# Patient Record
Sex: Female | Born: 1950 | Race: White | Hispanic: No | Marital: Single | Smoking: Current every day smoker
Health system: Southern US, Community
[De-identification: ages and names within clinical notes are randomized; demographics above are authoritative.]

## PROBLEM LIST (undated history)

## (undated) DIAGNOSIS — F329 Major depressive disorder, single episode, unspecified: Secondary | ICD-10-CM

## (undated) DIAGNOSIS — Z780 Asymptomatic menopausal state: Secondary | ICD-10-CM

## (undated) DIAGNOSIS — J310 Chronic rhinitis: Secondary | ICD-10-CM

## (undated) DIAGNOSIS — F32A Depression, unspecified: Secondary | ICD-10-CM

## (undated) DIAGNOSIS — M8580 Other specified disorders of bone density and structure, unspecified site: Secondary | ICD-10-CM

## (undated) HISTORY — DX: Chronic rhinitis: J31.0

## (undated) HISTORY — DX: Other specified disorders of bone density and structure, unspecified site: M85.80

## (undated) HISTORY — PX: ELBOW SURGERY: SHX618

## (undated) HISTORY — PX: FOOT SURGERY: SHX648

## (undated) HISTORY — DX: Asymptomatic menopausal state: Z78.0

## (undated) HISTORY — PX: NASAL POLYP SURGERY: SHX186

## (undated) HISTORY — PX: OTHER SURGICAL HISTORY: SHX169

## (undated) HISTORY — DX: Depression, unspecified: F32.A

## (undated) HISTORY — DX: Major depressive disorder, single episode, unspecified: F32.9

---

## 1997-09-30 ENCOUNTER — Other Ambulatory Visit: Admission: RE | Admit: 1997-09-30 | Discharge: 1997-09-30 | Payer: Self-pay | Admitting: Obstetrics and Gynecology

## 1997-10-15 ENCOUNTER — Other Ambulatory Visit: Admission: RE | Admit: 1997-10-15 | Discharge: 1997-10-15 | Payer: Self-pay | Admitting: Obstetrics & Gynecology

## 1997-10-24 ENCOUNTER — Other Ambulatory Visit: Admission: RE | Admit: 1997-10-24 | Discharge: 1997-10-24 | Payer: Self-pay | Admitting: Obstetrics and Gynecology

## 1997-11-13 ENCOUNTER — Other Ambulatory Visit: Admission: RE | Admit: 1997-11-13 | Discharge: 1997-11-13 | Payer: Self-pay | Admitting: Obstetrics and Gynecology

## 1999-07-29 ENCOUNTER — Other Ambulatory Visit: Admission: RE | Admit: 1999-07-29 | Discharge: 1999-07-29 | Payer: Self-pay | Admitting: Obstetrics and Gynecology

## 1999-12-29 ENCOUNTER — Encounter: Admission: RE | Admit: 1999-12-29 | Discharge: 1999-12-29 | Payer: Self-pay | Admitting: Obstetrics and Gynecology

## 1999-12-29 ENCOUNTER — Encounter: Payer: Self-pay | Admitting: Obstetrics and Gynecology

## 2001-03-14 ENCOUNTER — Encounter: Admission: RE | Admit: 2001-03-14 | Discharge: 2001-03-14 | Payer: Self-pay | Admitting: *Deleted

## 2001-07-09 ENCOUNTER — Other Ambulatory Visit: Admission: RE | Admit: 2001-07-09 | Discharge: 2001-07-09 | Payer: Self-pay | Admitting: Obstetrics and Gynecology

## 2002-03-20 ENCOUNTER — Encounter: Payer: Self-pay | Admitting: Internal Medicine

## 2002-03-20 ENCOUNTER — Encounter: Admission: RE | Admit: 2002-03-20 | Discharge: 2002-03-20 | Payer: Self-pay | Admitting: Internal Medicine

## 2002-07-29 ENCOUNTER — Ambulatory Visit (HOSPITAL_BASED_OUTPATIENT_CLINIC_OR_DEPARTMENT_OTHER): Admission: RE | Admit: 2002-07-29 | Discharge: 2002-07-29 | Payer: Self-pay | Admitting: *Deleted

## 2002-07-29 ENCOUNTER — Encounter (INDEPENDENT_AMBULATORY_CARE_PROVIDER_SITE_OTHER): Payer: Self-pay | Admitting: *Deleted

## 2002-09-24 ENCOUNTER — Other Ambulatory Visit: Admission: RE | Admit: 2002-09-24 | Discharge: 2002-09-24 | Payer: Self-pay | Admitting: *Deleted

## 2002-11-06 ENCOUNTER — Encounter: Admission: RE | Admit: 2002-11-06 | Discharge: 2002-11-06 | Payer: Self-pay | Admitting: Family Medicine

## 2002-11-06 ENCOUNTER — Encounter: Payer: Self-pay | Admitting: Family Medicine

## 2003-04-24 ENCOUNTER — Encounter: Admission: RE | Admit: 2003-04-24 | Discharge: 2003-04-24 | Payer: Self-pay | Admitting: Obstetrics and Gynecology

## 2003-10-02 ENCOUNTER — Other Ambulatory Visit: Admission: RE | Admit: 2003-10-02 | Discharge: 2003-10-02 | Payer: Self-pay | Admitting: Obstetrics and Gynecology

## 2003-11-20 ENCOUNTER — Encounter: Admission: RE | Admit: 2003-11-20 | Discharge: 2003-11-20 | Payer: Self-pay | Admitting: Internal Medicine

## 2003-11-27 ENCOUNTER — Encounter: Admission: RE | Admit: 2003-11-27 | Discharge: 2003-11-27 | Payer: Self-pay | Admitting: Internal Medicine

## 2004-06-03 ENCOUNTER — Encounter: Admission: RE | Admit: 2004-06-03 | Discharge: 2004-06-03 | Payer: Self-pay | Admitting: Obstetrics and Gynecology

## 2004-06-10 ENCOUNTER — Ambulatory Visit: Payer: Self-pay | Admitting: Internal Medicine

## 2005-03-31 ENCOUNTER — Other Ambulatory Visit: Admission: RE | Admit: 2005-03-31 | Discharge: 2005-03-31 | Payer: Self-pay | Admitting: Obstetrics and Gynecology

## 2005-07-07 ENCOUNTER — Encounter: Admission: RE | Admit: 2005-07-07 | Discharge: 2005-07-07 | Payer: Self-pay | Admitting: Obstetrics and Gynecology

## 2005-08-17 ENCOUNTER — Ambulatory Visit: Payer: Self-pay | Admitting: Internal Medicine

## 2006-05-26 ENCOUNTER — Ambulatory Visit: Payer: Self-pay | Admitting: Internal Medicine

## 2006-05-26 LAB — CONVERTED CEMR LAB
ALT: 19 units/L (ref 0–40)
Albumin: 3.9 g/dL (ref 3.5–5.2)
Alkaline Phosphatase: 63 units/L (ref 39–117)
Calcium: 9.5 mg/dL (ref 8.4–10.5)
Chloride: 106 meq/L (ref 96–112)
GFR calc non Af Amer: 69 mL/min
HCT: 42 % (ref 36.0–46.0)
HDL: 68.3 mg/dL (ref >39.0)
Hemoglobin: 14.5 g/dL (ref 12.0–15.0)
Lymphocytes Relative: 30.1 % (ref 12.0–46.0)
MCHC: 34.5 g/dL (ref 30.0–36.0)
MCV: 92.6 fL (ref 78.0–100.0)
Monocytes Absolute: 0.4 10*3/uL (ref 0.2–0.7)
Neutrophils Relative %: 60.4 % (ref 43.0–77.0)
Potassium: 4.5 meq/L (ref 3.5–5.1)
RBC: 4.54 M/uL (ref 3.87–5.11)
RDW: 12.8 % (ref 11.5–14.6)
Sodium: 143 meq/L (ref 135–145)
TSH: 1.84 microintl units/mL (ref 0.35–5.50)
Total Bilirubin: 0.9 mg/dL (ref 0.3–1.2)

## 2006-07-13 ENCOUNTER — Encounter: Admission: RE | Admit: 2006-07-13 | Discharge: 2006-07-13 | Payer: Self-pay | Admitting: Obstetrics and Gynecology

## 2006-11-15 ENCOUNTER — Encounter: Payer: Self-pay | Admitting: Family Medicine

## 2006-11-16 ENCOUNTER — Ambulatory Visit: Payer: Self-pay | Admitting: Family Medicine

## 2006-11-16 DIAGNOSIS — F329 Major depressive disorder, single episode, unspecified: Secondary | ICD-10-CM | POA: Insufficient documentation

## 2007-07-19 ENCOUNTER — Encounter: Admission: RE | Admit: 2007-07-19 | Discharge: 2007-07-19 | Payer: Self-pay | Admitting: Obstetrics and Gynecology

## 2007-08-02 ENCOUNTER — Telehealth (INDEPENDENT_AMBULATORY_CARE_PROVIDER_SITE_OTHER): Payer: Self-pay | Admitting: *Deleted

## 2007-09-13 ENCOUNTER — Telehealth (INDEPENDENT_AMBULATORY_CARE_PROVIDER_SITE_OTHER): Payer: Self-pay | Admitting: *Deleted

## 2007-09-20 ENCOUNTER — Ambulatory Visit: Payer: Self-pay | Admitting: Internal Medicine

## 2007-09-20 DIAGNOSIS — M899 Disorder of bone, unspecified: Secondary | ICD-10-CM | POA: Insufficient documentation

## 2007-09-20 DIAGNOSIS — J309 Allergic rhinitis, unspecified: Secondary | ICD-10-CM | POA: Insufficient documentation

## 2007-09-20 DIAGNOSIS — M949 Disorder of cartilage, unspecified: Secondary | ICD-10-CM

## 2008-02-21 ENCOUNTER — Ambulatory Visit: Payer: Self-pay | Admitting: Internal Medicine

## 2008-02-21 DIAGNOSIS — F17200 Nicotine dependence, unspecified, uncomplicated: Secondary | ICD-10-CM | POA: Insufficient documentation

## 2008-02-21 DIAGNOSIS — R1033 Periumbilical pain: Secondary | ICD-10-CM | POA: Insufficient documentation

## 2008-02-21 LAB — CONVERTED CEMR LAB
Bilirubin Urine: NEGATIVE
Blood in Urine, dipstick: NEGATIVE
Ketones, urine, test strip: NEGATIVE
Nitrite: NEGATIVE
Specific Gravity, Urine: <1.005

## 2008-03-03 ENCOUNTER — Encounter (INDEPENDENT_AMBULATORY_CARE_PROVIDER_SITE_OTHER): Payer: Self-pay | Admitting: *Deleted

## 2008-03-03 LAB — CONVERTED CEMR LAB
ALT: 20 units/L (ref 0–35)
AST: 24 units/L (ref 0–37)
Albumin: 4.2 g/dL (ref 3.5–5.2)
Alkaline Phosphatase: 65 units/L (ref 39–117)
Basophils Absolute: 0 10*3/uL (ref 0.0–0.1)
Basophils Relative: 0.7 % (ref 0.0–3.0)
Bilirubin, Direct: 0.2 mg/dL (ref 0.0–0.3)
HCT: 43.1 % (ref 36.0–46.0)
Hemoglobin: 14.9 g/dL (ref 12.0–15.0)
MCHC: 34.7 g/dL (ref 30.0–36.0)
Neutrophils Relative %: 62.6 % (ref 43.0–77.0)
Platelets: 222 10*3/uL (ref 150–400)
RDW: 12.8 % (ref 11.5–14.6)
Total Protein: 7.4 g/dL (ref 6.0–8.3)
Vit D, 1,25-Dihydroxy: 35 (ref 30–89)
WBC: 5 10*3/uL (ref 4.5–10.5)

## 2008-03-10 ENCOUNTER — Ambulatory Visit: Payer: Self-pay | Admitting: Internal Medicine

## 2008-03-11 ENCOUNTER — Encounter (INDEPENDENT_AMBULATORY_CARE_PROVIDER_SITE_OTHER): Payer: Self-pay | Admitting: *Deleted

## 2008-05-23 HISTORY — PX: UPPER GASTROINTESTINAL ENDOSCOPY: SHX188

## 2008-05-23 HISTORY — PX: COLONOSCOPY W/ POLYPECTOMY: SHX1380

## 2008-05-23 HISTORY — PX: CHOLECYSTECTOMY: SHX55

## 2008-09-04 ENCOUNTER — Encounter: Admission: RE | Admit: 2008-09-04 | Discharge: 2008-09-04 | Payer: Self-pay | Admitting: Gastroenterology

## 2008-09-26 ENCOUNTER — Encounter: Payer: Self-pay | Admitting: Internal Medicine

## 2008-10-06 ENCOUNTER — Encounter: Payer: Self-pay | Admitting: Internal Medicine

## 2008-11-13 ENCOUNTER — Encounter: Payer: Self-pay | Admitting: Internal Medicine

## 2008-12-01 ENCOUNTER — Ambulatory Visit (HOSPITAL_COMMUNITY): Admission: RE | Admit: 2008-12-01 | Discharge: 2008-12-01 | Payer: Self-pay | Admitting: Surgery

## 2008-12-04 ENCOUNTER — Telehealth (INDEPENDENT_AMBULATORY_CARE_PROVIDER_SITE_OTHER): Payer: Self-pay | Admitting: *Deleted

## 2008-12-19 ENCOUNTER — Ambulatory Visit (HOSPITAL_COMMUNITY): Admission: RE | Admit: 2008-12-19 | Discharge: 2008-12-19 | Payer: Self-pay | Admitting: Surgery

## 2008-12-19 ENCOUNTER — Encounter (INDEPENDENT_AMBULATORY_CARE_PROVIDER_SITE_OTHER): Payer: Self-pay | Admitting: Surgery

## 2009-01-08 ENCOUNTER — Encounter: Payer: Self-pay | Admitting: Internal Medicine

## 2009-03-27 ENCOUNTER — Encounter (INDEPENDENT_AMBULATORY_CARE_PROVIDER_SITE_OTHER): Payer: Self-pay | Admitting: *Deleted

## 2009-07-09 ENCOUNTER — Ambulatory Visit: Payer: Self-pay | Admitting: Internal Medicine

## 2009-07-09 DIAGNOSIS — F411 Generalized anxiety disorder: Secondary | ICD-10-CM | POA: Insufficient documentation

## 2009-07-30 ENCOUNTER — Encounter: Admission: RE | Admit: 2009-07-30 | Discharge: 2009-07-30 | Payer: Self-pay | Admitting: Obstetrics and Gynecology

## 2009-10-07 ENCOUNTER — Telehealth (INDEPENDENT_AMBULATORY_CARE_PROVIDER_SITE_OTHER): Payer: Self-pay | Admitting: *Deleted

## 2010-04-08 ENCOUNTER — Telehealth (INDEPENDENT_AMBULATORY_CARE_PROVIDER_SITE_OTHER): Payer: Self-pay | Admitting: *Deleted

## 2010-06-22 NOTE — Progress Notes (Signed)
Summary: Sertraline refill  Phone Note Refill Request Message from:  Fax from Pharmacy on April 08, 2010 5:00 PM  Refills Requested: Medication #1:  SERTRALINE HCL 50 MG daily as directed   Last Refilled: 01/04/2010   Notes: [facility] [address], [place], [place]   ph=[phone]   fax [phone]   qty =90  Initial call taken by: Jerolyn Shin,  April 08, 2010 5:04 PM  Follow-up for Phone Call        Left message on machine for patient to return call when avaliable, Reason for call:   ? how patient is taking medication, instructions say as directed and the pharmacy will send back and ask for specific instruction Follow-up by: Shonna Chock CMA,  April 09, 2010 8:24 AM  Additional Follow-up for Phone Call Additional follow up Details #1::        Called patient on work number-disconnected  Tried to reach patient again at home number:Spoke with patient and she indicated that she is taking 1 by mouth once daily of 50mg  tab, noted on med list and sent back to pharmacy Additional Follow-up by: Shonna Chock CMA,  April 09, 2010 11:38 AM    New/Updated Medications: * SERTRALINE HCL 50 MG 1 by mouth once daily Prescriptions: SERTRALINE HCL 50 MG 1 by mouth once daily  #90 x 2   Entered by:   Shonna Chock CMA   Authorized by:   Marga Melnick MD   Signed by:   Shonna Chock CMA on 04/09/2010   Method used:   Faxed to ...       [facility] [address] [facility] [address] (retail)       [address] [place], [place]  [address]       Ph: [phone]       Fax: [phone]   RxID:   [id number]

## 2010-06-22 NOTE — Progress Notes (Signed)
Summary: refill   Phone Note Refill Request Message from:  Fax from Pharmacy on Oct 07, 2009 4:05 PM  Refills Requested: Medication #1:  SERTRALINE HCL 50 MG daily as directed fax from [place] - fax [phone]  - ph [phone]   Method Requested: Fax to Local Pharmacy Initial call taken by: Okey Regal Spring,  Oct 07, 2009 4:06 PM    Prescriptions: SERTRALINE HCL 50 MG daily as directed  #90 x 1   Entered by:   Shonna Chock   Authorized by:   Marga Melnick MD   Signed by:   Shonna Chock on 10/07/2009   Method used:   Faxed to ...       [facility] [address] [facility] [address] (retail)       [address] [place], [place]  [address]       Ph: [phone]       Fax: [phone]   RxID:   [id number]

## 2010-06-22 NOTE — Assessment & Plan Note (Signed)
Summary: refill antidepressant//lch   Vital Signs:  Patient profile:   60 year old female Weight:      175 pounds Pulse rate:   72 / minute Resp:     15 per minute BP sitting:   130 / 60  Vitals Entered By: rachel peeler CC: discuss meds Comments pt. wants to discuss zoloft, and needs omnaris refilled pt. wants to schedule a cpx   Primary Care Provider:  Laury Axon  CC:  discuss meds.  History of Present Illness: Her best friend has lung cancer; this has been major stress. Sertraline 50 mg  was weaned several months ago. With dose every 2 days her menopausal symptoms worsened.  Allergies: 1)  ! Pcn 2)  ! Claritin 3)  ! * Astelin 4)  ! Avelox 5)  ! Fosamax 6)  ! Lexapro 7)  ! * Thimoserol  Past History:  Past Surgical History: ELBOW SURGERY SHATTERED 2 to fall gravid 0 para 0 sinus surgery 07/29/2002 foot surgery pinned hammer toe colonoscopy negative 2001, done for gas Cholecystectomy 2010, Dr Michaell Cowing  Review of Systems General:  Denies sleep disorder. CV:  Denies palpitations. GI:  Denies diarrhea. Neuro:  Denies numbness, tingling, and tremors. Psych:  Complains of anxiety; denies depression, easily angered, easily tearful, irritability, and panic attacks.  Physical Exam  General:  well-nourished; alert,appropriate and cooperative throughout examination Eyes:  No corneal or conjunctival inflammation noted.  Perrla. No lid lag Neck:  No deformities, masses, or tenderness noted. Heart:  Normal rate and regular rhythm. S1 and S2 normal without gallop, murmur, click, rub. Neurologic:  alert & oriented X3 and DTRs symmetrical and normal.  No tremor Skin:  Intact without suspicious lesions or rashes Psych:  memory intact for recent and remote, normally interactive, good eye contact, not anxious appearing, and not depressed appearing.     Impression & Recommendations:  Problem # 1:  ANXIETY DISORDER (ICD-300.00) Sertraline 50 mg 1/2 to 1 once daily as needed to  control anxiety  Problem # 2:  SMOKER (ICD-305.1) risks discussed  Complete Medication List: 1)  Sertraline Hcl 50 Mg  .... Daily as directed 2)  Veramyst 27.5 Mcg/spray Susp (Fluticasone furoate) .Marland Kitchen.. 1 spray two times a day as needed 3)  Ranitidine Hcl 150 Mg Tabs (Ranitidine hcl) .Marland Kitchen.. 1 q 12 ac meals as needed 4)  Mvi (2000iu Vit D)  .Marland Kitchen.. 1t daily 5)  Calcium 600mg   .... 1t daily 6)  Progesteron Cream (20ml)  .... Every day  Patient Instructions: 1)  Zoloft 50 mg 1/2 once daily  Prescriptions: SERTRALINE HCL 50 MG daily as directed  #90 x 0   Entered and Authorized by:   Marga Melnick MD   Signed by:   Marga Melnick MD on 07/09/2009   Method used:   Print then Give to Patient   RxID:   [id number]

## 2010-08-03 ENCOUNTER — Telehealth (INDEPENDENT_AMBULATORY_CARE_PROVIDER_SITE_OTHER): Payer: Self-pay | Admitting: *Deleted

## 2010-08-10 NOTE — Progress Notes (Signed)
Summary: pt friend dying needs rx   Phone Note Call from Patient Call back at [phone]   Caller: Patient Call For: Marga Melnick MD Summary of Call: patients friend of 30 years is dying & patient wantsrx called in to help her  get [facility] thru [facility] [address] Initial call taken by: Okey Regal Spring,  August 03, 2010 1:35 PM  Follow-up for Phone Call        pls advise.Marland KitchenDoristine Devoid CMA  August 03, 2010 2:11 PM   Additional Follow-up for Phone Call Additional follow up Details #1::        see Rx Additional Follow-up by: Marga Melnick MD,  August 03, 2010 4:51 PM    Additional Follow-up for Phone Call Additional follow up Details #2::    Left message on voicemail informing patient rx sent to pharmacy  Follow-up by: Shonna Chock CMA,  August 03, 2010 4:57 PM  New/Updated Medications: LORAZEPAM 0.5 MG TABS (LORAZEPAM) 1 every 8 hrs as needed stress Prescriptions: LORAZEPAM 0.5 MG TABS (LORAZEPAM) 1 every 8 hrs as needed stress  #30 x 0   Entered and Authorized by:   Marga Melnick MD   Signed by:   Marga Melnick MD on 08/03/2010   Method used:   Printed then faxed to ...       [facility] [address] [phone]* (retail)       [address]       [place], [place]  [address]       Ph: [phone]       Fax: [phone]   RxID:   [id number]   Appended Document: pt friend dying needs rx  Note from pharmacy requested we resend or call in rx, rx called in  Appended Document: Rx called to wrong pharmacy     Phone Note Call from Patient Call back at [phone]   Caller: Patient Call For: Marga Melnick MD Summary of Call: Patient states the medication was called into the wrong pharmacy. [facility] Initial call taken by: Barnie Mort,  August 05, 2010 11:02 AM  Follow-up for Phone Call        [facility] [address]. Called [facility] and cancelled rx  Follow-up by: Shonna Chock CMA,  August 05, 2010 11:05 AM

## 2010-08-29 LAB — HEMOGLOBIN AND HEMATOCRIT, BLOOD: HCT: 40.2 % (ref 36.0–46.0)

## 2010-10-05 NOTE — Op Note (Signed)
NAMEBRITTLEY, REGNER                ACCOUNT NO.:  [id number]   MEDICAL RECORD NO.:  [id number]          PATIENT TYPE:  AMB   LOCATION:  DAY                          FACILITY[facility]  [facility]   PHYSICIAN:  Ardeth Sportsman, MD     DATE OF BIRTH:  [birth year]   DATE OF PROCEDURE:  DATE OF DISCHARGE:  12/19/2008                               OPERATIVE REPORT   PRIMARY CARE PHYSICIAN:  Dr. Marga Melnick.   GASTROENTEROLOGIST:  Dr. Dorena Cookey at [facility].   SURGEON:  Dr. Karie Soda.   ASSISTANT:  None.   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   POSTOPERATIVE DIAGNOSES:  Biliary dyskinesia with probable chronic  cholecystitis.   PROCEDURE PERFORMED:  Laparoscopic cholecystectomy, single-site  technique with intraoperative cholangiogram.   ANESTHESIA:  1. General anesthesia.  2. Local anesthetic in a field block around all port sites.   SPECIMENS:  Gallbladder.   DRAINS:  None.   ESTIMATED BLOOD LOSS:  20 mL.   COMPLICATIONS:  None apparent.   INDICATIONS:  Ms. Nakata is a pleasant 60 year old female who has been  struggling with some intermittent abdominal pains in the past and has  had an extensive workup, including upper and lower endoscopy by Dr.  Madilyn Fireman that showed some Helicobacter pylori positive.  She had an  ultrasound that was negative, but a HIDA scan which was suggestive of  biliary dyskinesia given the fact that her gallbladder ejection fraction  was 6% and reproduced symptoms.   The anatomy and physiology of hepatobiliary and pancreatic function was  discussed.  The pathophysiology of biliary dyskinesia and chronic  cholecystitis was discussed.  Options discussed and recommendations made  for laparoscopic cholecystectomy with intraoperative cholangiogram.  The  risks, benefits and alternatives were discussed.  Questions answered.  She agreed to proceed.  The risk that the surgery would not control her  pain and  symptoms would continue, arguing that there was another source  of the pain and symptoms was discussed as well.  The patient agreed to  proceed.   OPERATIVE FINDINGS:  She had moderate gallbladder wall thickening  concerning for chronic cholecystitis.  Her cystic duct did not seem  particularly narrow.  Her biliary system appeared normal.  The rest of  her intra-abdominal organs appeared normal as well.   DESCRIPTION OF PROCEDURE:  Informed consent was confirmed.  The patient  underwent general anesthesia any difficulty.  She had voided just prior  to the operating room.  She had sequential compression devices active  during the entire case.  She was positioned supine with both arms  tucked.  Her abdomen was prepped and draped in a sterile fashion.  Surgical timeout confirmed our plan.   Entry was gained in the abdomen through a supraumbilical 3-cm  curvilinear incision.  I placed a #5 mm port supraumbilically through  the stalk using Hassan technique.  Capnoperitoneum to 15 with 5 mmHg  initially provided preperitoneal insufflation, but with redirection I  was able to put the port back into the peritoneum and capnoperitoneum  was induced.  Camera  inspection revealed no intra-abdominal injury.  Under direct visualization, a 5-mm port was placed a fingerbreadth to  the left paramedian of this.  A #5 mL blunt grasper was placed a  fingerbreadth right in and inferior to the port site directly through  the fascia.  Camera inspection revealed no intra-abdominal injury.   The patient was positioned right side up, head side up.  The gallbladder  fundus was grasped and then I gradually marched up to the middle part of  the gallbladder to elevate it cephalad.  This exposed the infundibulum  of the gallbladder well.  Ultrasonic dissection was used to free the  peritoneal covering between the infundibulum and posterolateral wall of  the gallbladder off the liver.  Dissection was done to help  free the  back wall.   The infundibulum was pulled more laterally and I was able to use the  ultrasonic dissection to free the peritoneal coverings around the  infundibulum up the anteromedial wall of the gallbladder.  Meticulous  dissection was done between ultrasonic dissection and blunt Maryland  dissector dissection to get a good critical view by freeing the proximal  half of the gallbladder off the liver bed.  I did a little extra  dissection so I could pull the gallbladder out laterally and get a good  critical view using an angled scope.  Once I confirmed that only the  cystic artery and cystic duct were going down the porta hepatis, I went  ahead and ligated the cystic artery using ultrasonic dissection to good  result.   This left just the cystic duct leaving the infundibulum.  I skeletonized  it carefully further.  Two clips were placed in the infundibulum and a  partial cystic ductotomy was performed.  I got clear bile back.  There  were no stones to be milked back.  A #5-French cholangiocatheter was  placed through a right subcostal puncture site and into the cystic duct.   I ran a cholangiogram using dilute radiopaque contrast and continuous  fluoroscopy.  Contrast flowed from a helical side branch, consistent  with cystic duct cannulization.  Contrast easily flowed into the common,  hepatic and bile ducts down across a normal ampulla to the duodenum  easily.  It gradually refluxed up into the right and left intrahepatic  chains with classic anatomy and no evidence of any injury, stones or  other abnormalities.  The cholangiocatheter was removed.  I placed four  clips on the cystic duct just proximal to the cystic ductotomy since  there was a nice long cystic duct.  With that, I completed transection  of the cystic duct.  The gallbladder was freed from its remaining  attachments on the liver bed and pulled out the supraumbilical fascial  defect by opening it up carefully  bluntly.  The fascial defect was  reapproximated using 0 Vicryl interrupted stitches x3.   Camera inspection revealed no intra-abdominal injury.  Hemostasis was  excellent on the liver bed and the clips were intact on the cystic duct  stump.  Copious irrigation was done with clear return at the end and  there was no more bleeding.  Again, I made reinspection of the entire  abdomen and there was no evidence of any bowel injury or other  abnormalities.  The patient was extubated and sent to the recovery room  in stable condition.   I discussed postoperative care with the patient in detail in my office  and just prior to surgery and  discussed it with her family right after  surgery as well.  They expressed understanding and appreciation.      Ardeth Sportsman, MD  Electronically Signed     SCG/MEDQ  D:  12/19/2008  T:  12/20/2008  Job:  [id number]   cc:   Everardo All. Madilyn Fireman, M.D.  Fax: [phone]   Titus Dubin. Alwyn Ren, MD,FACP,FCCP  [phone]  [place] [address]

## 2010-10-08 NOTE — Op Note (Signed)
Caroline Graves, Caroline Graves                          ACCOUNT NO.:  [id number]   MEDICAL RECORD NO.:  [id number]                   PATIENT TYPE:  AMB   LOCATION:  DSC                                  FACILITY:  [facility]   PHYSICIAN:  Kathy Breach, M.D.                   DATE OF BIRTH:  04-21-1951   DATE OF PROCEDURE:  07/29/2002  DATE OF DISCHARGE:                                 OPERATIVE REPORT   PREOPERATIVE DIAGNOSES:  1. Deviated nasal septum.  2. Hyperplastic nasal turbinates.  3. Pansinus nasal polyposis.   PROCEDURES:  1. Nasal septoplasty.  2. Bilateral endoscopic complete ethmoidectomy.  3. Bilateral endoscopic ostiomeatal windows.  4. Bilateral submucous resection, inferior turbinates.   POSTOPERATIVE DIAGNOSES:  1. Deviated nasal septum.  2. Hyperplastic nasal turbinates.  3. Pansinus nasal polyposis.   DESCRIPTION OF PROCEDURE:  With the patient under general orotracheal  anesthesia, the nose was prepped and draped in a sterile fashion.  Nasal  constriction by topical 4% Xylocaine/ephedrine solution on olive-tipped  probes applied to the anterior ethmoid and sphenopalatine nerve areas  bilaterally.  Cotton pledgets soaked in a similar solution inserted along  the middle and inferior turbinates.  The columella and nasal septum were  infiltrated with 1% Xylocaine and 1:100,000 epinephrine.  As the case  proceeded, infiltration of the middle meatus and inferior turbinates  likewise.  Examination of the patient's nose immediately revealed large,  hyperplastic turbinates, inferior and middle, nearly totally obstructing the  airways.  With vasoconstriction, a sharp 4+ vomerine spur to the left as  well as a general deviation of the superior septum to the left present.  A  caudal incision was made inside the left nasal vestibule and mucoperiosteal  flaps were elevated off the cartilaginous and bony septum bilaterally.  The  posterior quadrilateral cartilage separated from the  vomer and the vomerine  spur removed.  The quadrilateral cartilage separated posterior superiorly  from the deflected perpendicular ethmoid plate, which was also removed.  The  entire quadrilateral cartilage was kept intact and positioned anteriorly.  This gave a straight near-midline septum.   Under endoscopic visualization beginning on the left side, the middle  turbinate was medially fractured.  The middle meatus filled with polypoid  degeneration of the mucosa and some frank polyps.  Using the suction  debrider, complete ethmoidectomy was done.  On the left side all the ethmoid  cells filled, packed with allergic-like polyps.  There was some air in the  posteriormost ethmoid cell on the left side consistent with findings of the  preoperative CT scans.  As polypoid degeneration of the posterior extent of  the middle turbinate and the sphenoid rostrum area cleared, the ostium of  the left sphenoid sinus penetrated readily with suction and cleared.  Working anterior along the roof of the ethmoid, the frontal recess area was  cleaned and polyps removed  and there was good completion of the anterior  ethmoid superiorly.  The curved suction could be readily passed through the  frontal recess into the frontal sinus area.  The ostiomeatal area was probed  and readily penetrated through the membranous area and a 1 cm window into  the left antrum was created.   Essentially a similar procedure was done on the right side.  The right  middle turbinate, having much more polypoid degeneration and with relatively  minimal bleeding on the left side, fairly constant oozing, bleeding from the  middle turbinate on the right side slowed the procedure and added to the  blood loss, but complete ethmoidectomy was completed as well as enlarging  the ostiomeatal area on the left side as well as cleaning the anterior  ethmoid cells and frontal recess, in completion of which the curved suction  could be readily  passed into the frontal sinus.   Stab incisions were then made over the anterior aspect of the left inferior  turbinate.  The superiorly-based septal surface mucosa elevated off the  large, prominent turbinate bone.  The lower half of the turbinate bone with  attached mucosa was sharply excised with angled scissors.  The posterior  extension was reduced by cauterization and hemostasis along the bony and  mucosal resection line of the turbinate controlled with suction cautery.  The remaining inferior turbinate bone was outfractured.  A similar procedure  was done for the right inferior turbinate.   The septal incision was then closed with interrupted 4-0 chromic catgut  sutures.  The nose was then packed bilaterally with packing, initially the  ethmoidectomy site and then the nasal chamber with strips of Vaseline gauze  impregnated with bacitracin ointment.  At the completion of the case the  oropharynx and nasopharynx suctioned clear and no active bleeding into the  nasopharynx occurring.  Blood loss by suction canister estimated at around  500 mL.  The patient tolerated the procedure well and was taken to the  recovery room in stable general condition.                                               Kathy Breach, M.D.    Caroline Graves  D:  07/29/2002  T:  07/29/2002  Job:  [id number]

## 2010-12-31 ENCOUNTER — Other Ambulatory Visit: Payer: Self-pay | Admitting: Internal Medicine

## 2010-12-31 MED ORDER — SERTRALINE HCL 50 MG PO TABS: 50.0000 mg | ORAL_TABLET | Freq: Every day | ORAL | Status: DC

## 2010-12-31 NOTE — Telephone Encounter (Signed)
RX Sent to the pharmacy, patient needs to schedule a CPX

## 2011-01-18 ENCOUNTER — Other Ambulatory Visit: Payer: Self-pay | Admitting: *Deleted

## 2011-01-18 MED ORDER — CICLESONIDE 50 MCG/ACT NA SUSP: 2.0000 | Freq: Every day | NASAL | Status: DC

## 2011-03-02 ENCOUNTER — Encounter: Payer: Self-pay | Admitting: Internal Medicine

## 2011-03-03 ENCOUNTER — Encounter: Payer: Self-pay | Admitting: Internal Medicine

## 2011-03-03 ENCOUNTER — Ambulatory Visit (INDEPENDENT_AMBULATORY_CARE_PROVIDER_SITE_OTHER): Payer: Federal, State, Local not specified - PPO | Admitting: Internal Medicine

## 2011-03-03 DIAGNOSIS — Z8249 Family history of ischemic heart disease and other diseases of the circulatory system: Secondary | ICD-10-CM

## 2011-03-03 DIAGNOSIS — Z0000 Encounter for general adult medical examination without abnormal findings: Secondary | ICD-10-CM

## 2011-03-03 LAB — BASIC METABOLIC PANEL
CO2: 27 mEq/L (ref 19–32)
Calcium: 9 mg/dL (ref 8.4–10.5)
Chloride: 106 mEq/L (ref 96–112)
Glucose, Bld: 83 mg/dL (ref 70–99)
Potassium: 3.7 mEq/L (ref 3.5–5.1)
Sodium: 141 mEq/L (ref 135–145)

## 2011-03-03 LAB — LIPID PANEL
LDL Cholesterol: 76 mg/dL (ref 0–99)
VLDL: 10.8 mg/dL (ref 0.0–40.0)

## 2011-03-03 LAB — CBC WITH DIFFERENTIAL/PLATELET
Basophils Absolute: 0 10*3/uL (ref 0.0–0.1)
HCT: 43.2 % (ref 36.0–46.0)
Hemoglobin: 14.6 g/dL (ref 12.0–15.0)
Lymphs Abs: 1.7 10*3/uL (ref 0.7–4.0)
MCHC: 33.7 g/dL (ref 30.0–36.0)
Monocytes Absolute: 0.3 10*3/uL (ref 0.1–1.0)
Monocytes Relative: 4.9 % (ref 3.0–12.0)
Neutro Abs: 3.2 10*3/uL (ref 1.4–7.7)
Platelets: 219 10*3/uL (ref 150.0–400.0)
RBC: 4.51 Mil/uL (ref 3.87–5.11)

## 2011-03-03 LAB — HEPATIC FUNCTION PANEL
ALT: 20 U/L (ref 0–35)
Alkaline Phosphatase: 74 U/L (ref 39–117)
Bilirubin, Direct: 0.1 mg/dL (ref 0.0–0.3)
Total Bilirubin: 0.8 mg/dL (ref 0.3–1.2)
Total Protein: 7.4 g/dL (ref 6.0–8.3)

## 2011-03-03 LAB — TSH: TSH: 1.87 u[IU]/mL (ref 0.35–5.50)

## 2011-03-03 MED ORDER — SERTRALINE HCL 50 MG PO TABS: 50.0000 mg | ORAL_TABLET | Freq: Every day | ORAL | Status: DC

## 2011-03-03 NOTE — Patient Instructions (Signed)
Preventive Health Care: Exercise  30-45  minutes a day, 3-4 days a week. Walking is especially valuable in preventing Osteoporosis. Eat a low-fat diet with lots of fruits and vegetables, up to 7-9 servings per day. Consume less than 30 grams of sugar per day from foods & drinks with High Fructose Corn Syrup as # 1,2,3 or #4 on label. Alcohol If you drink, do it moderately - 1 drink per day or less.  Health Care Power of Attorney & Living Will place you in charge of your health care  decisions. Verify these are  in place. Please think about quitting smoking. Review the risks we discussed. Please call 1-800-QUIT-NOW 210-042-1324) for free smoking cessation counseling.  Consider  [facility]'s smoking cessation program @ www.[email or web address] or 231-034-4776.

## 2011-03-03 NOTE — Progress Notes (Signed)
Subjective:    Patient ID: Caroline Graves, female    DOB: 1951/03/04, 60 y.o.   MRN: [id number]  HPI  She  is here for a physical;acute issues include fungal nail changes.      Review of Systems Patient reports no vision/ hearing  changes, adenopathy,fever, weight change,  persistant / recurrent hoarseness , swallowing issues, chest pain,palpitations,edema,persistant /recurrent cough, hemoptysis, dyspnea( rest/ exertional/paroxysmal nocturnal), gastrointestinal bleeding(melena, rectal bleeding), abdominal pain, significant heartburn,  bowel changes,GU symptoms(dysuria, hematuria,pyuria, incontinence) ), Gyn symptoms(abnormal  bleeding , pain),  syncope, focal weakness, memory loss,numbness & tingling, hair /nail changes, or abnormal bruising or bleeding. Sertraline is effective     Objective:   Physical Exam Gen.: Healthy and well-nourished in appearance. Alert, appropriate and cooperative throughout exam. Head: Normocephalic without obvious abnormalities  Eyes: No corneal or conjunctival inflammation noted. Pupils equal round reactive to light and accommodation. Fundal exam is benign without hemorrhages, exudate, papilledema. Extraocular motion intact. Vision grossly normal with lenses Ears: External  ear exam reveals no significant lesions or deformities. Canals clear .TMs normal. Hearing is grossly normal bilaterally. Nose: External nasal exam reveals no deformity or inflammation. Nasal mucosa are pink and moist. No lesions or exudates noted. Mouth: Oral mucosa and oropharynx reveal no lesions or exudates. Teeth in good repair. Neck: No deformities, masses, or tenderness noted. Range of motion &. Thyroid  Normal. ? Cervical rib. Lungs: Normal respiratory effort; chest expands symmetrically. Lungs are clear to auscultation without rales, wheezes, or increased work of breathing. Heart: Normal rate and rhythm. Normal S1 and S2. No gallop, click, or rub. Grade 1/2 -1 systolic  Murmur  LSB. Abdomen: Bowel sounds normal; abdomen soft and nontender. No masses, organomegaly or hernias noted. Genitalia: Dr Stefano Gaul   .                                                                                   Musculoskeletal/extremities: No deformity or scoliosis noted of  the thoracic or lumbar spine , but slight asymmetry of thoracic muscles. No clubbing, cyanosis, edema, or deformity noted. Range of motion  normal .Tone & strength  normal.Joints normal. Nail health  Good; some discoloration of R great nail. Vascular: Carotid, radial artery, dorsalis pedis and  posterior tibial pulses are full and equal. No bruits present. Neurologic: Alert and oriented x3. Deep tendon reflexes symmetrical and normal.          Skin: Intact without suspicious lesions or rashes. Cherry angiomata Lymph: No cervical, axillary lymphadenopathy present. Psych: Mood and affect are normal. Normally interactive                                                                                         Assessment & Plan:  #1 comprehensive physical exam; no acute findings #2 see Problem List with Assessments & Recommendations  Plan: see Orders

## 2011-07-14 ENCOUNTER — Other Ambulatory Visit: Payer: Self-pay | Admitting: Internal Medicine

## 2011-12-01 ENCOUNTER — Telehealth: Payer: Self-pay | Admitting: *Deleted

## 2011-12-01 NOTE — Telephone Encounter (Signed)
Left message to call office

## 2011-12-01 NOTE — Telephone Encounter (Signed)
Pt Left VM that nasal spray is too expensive and would like to be changed to something else. .Please advise

## 2011-12-01 NOTE — Telephone Encounter (Signed)
Fluticasone 1 spray in each nostril twice a day as needed.

## 2011-12-02 ENCOUNTER — Other Ambulatory Visit: Payer: Self-pay | Admitting: Internal Medicine

## 2011-12-02 MED ORDER — FLUTICASONE PROPIONATE 50 MCG/ACT NA SUSP: 1.0000 | Freq: Two times a day (BID) | NASAL | Status: DC | PRN

## 2011-12-02 NOTE — Telephone Encounter (Signed)
Discuss with patient, Rx sent. 

## 2012-03-07 ENCOUNTER — Ambulatory Visit (INDEPENDENT_AMBULATORY_CARE_PROVIDER_SITE_OTHER): Payer: 59 | Admitting: Internal Medicine

## 2012-03-07 ENCOUNTER — Encounter: Payer: Self-pay | Admitting: Internal Medicine

## 2012-03-07 VITALS — BP 116/80 | HR 72 | Wt 181.4 lb

## 2012-03-07 DIAGNOSIS — F411 Generalized anxiety disorder: Secondary | ICD-10-CM

## 2012-03-07 DIAGNOSIS — M899 Disorder of bone, unspecified: Secondary | ICD-10-CM

## 2012-03-07 DIAGNOSIS — T887XXA Unspecified adverse effect of drug or medicament, initial encounter: Secondary | ICD-10-CM | POA: Insufficient documentation

## 2012-03-07 DIAGNOSIS — F17200 Nicotine dependence, unspecified, uncomplicated: Secondary | ICD-10-CM

## 2012-03-07 DIAGNOSIS — M949 Disorder of cartilage, unspecified: Secondary | ICD-10-CM

## 2012-03-07 MED ORDER — SERTRALINE HCL 50 MG PO TABS: 50.0000 mg | ORAL_TABLET | Freq: Every day | ORAL | Status: DC

## 2012-03-07 NOTE — Patient Instructions (Addendum)
Preventive Health Care: Exercise  30-45  minutes a day, 3-4 days a week. Walking is especially valuable in preventing Osteoporosis. Eat a low-fat diet with lots of fruits and vegetables, up to 7-9 servings per day.  Please take enteric-coated aspirin 81 mg daily with breakfast.  Please think about quitting smoking. Review the risks we discussed. Please call 1-800-QUIT-NOW (251-667-6284) for free smoking cessation counseling.   If you activate My Chart; the results can be released to you as soon as they populate from the lab. If you choose not to use this program; the labs have to be reviewed, copied & mailed   causing a delay in getting the results to you.

## 2012-03-07 NOTE — Progress Notes (Signed)
Subjective:    Patient ID: Caroline Graves, female    DOB: 18-Jul-1950, 61 y.o.   MRN: [id number]  HPI  She's been on sertraline for approximately 10 years. Initially she was on Lexapro but she stated that this caused "thoughts of death". She's also done better on the generic form than the branded Zoloft. She has no significant anxiety or depression at this time on this medication at this dose.  Family history includes depression in her mother and sister. Her father abused alcohol.  She is physically active in her job; she does not have a regular exercise program. She tries to restrict sweets. She is presently smoking a half-pack a day    Review of Systems She denies anhedonia, panic attacks, insomnia, anorexia or significant fatigue. She has had some weight gain. She has no associated headaches, numbness and tingling, or limb weakness. She denies hoarseness, blurred vision/double vision/loss of vision, temperature intolerance, bowel changes or skin/hair changes. She has been using tea tree oil for localized fungal toenail changes.  She describes chronic sinus symptoms with intermittent frontal discomfort and intermittent purulent secretions. She has no fixed frontal headache, facial pain, dental pain, or nasal purulence. She also denies fever, chills, or sweats. She's been using generic Flonase once daily as well as a Neti pot. She has had nasal polyp surgery        Objective:   Physical Exam Gen.:  well-nourished in appearance. Alert, appropriate and cooperative throughout exam. Head: Normocephalic without obvious abnormalities Eyes: No corneal or conjunctival inflammation noted. No lid lag or proptosis. Extraocular motion intact. Vision grossly normal. Ears: External  ear exam reveals no significant lesions or deformities. Canals clear .TMs normal. Hearing is grossly normal bilaterally. Nose: External nasal exam reveals no deformity or inflammation. Nasal mucosa are pink and moist. No  lesions or exudates noted. Hyponasal speech  Mouth: Oral mucosa and oropharynx reveal no lesions or exudates. Teeth in good repair. Neck: No deformities, masses, or tenderness noted. Range of motion & Thyroid ULN in size. Lungs: Normal respiratory effort; chest expands symmetrically. Lungs are clear to auscultation without rales, wheezes, or increased work of breathing. Heart: Normal rate and rhythm. Normal S1 and S2. No gallop, click, or rub. Grade 1/6 systolic murmur  Abdomen: Bowel sounds normal; abdomen soft and nontender. No masses, organomegaly or hernias noted. Genitalia: Dr Lowell Guitar                                                                            Musculoskeletal/extremities: No deformity or scoliosis noted of  the thoracic or lumbar spine. No clubbing, cyanosis, edema, or deformity noted. Range of motion  normal .Tone & strength  normal.Joints normal. Nail health  good. Vascular: Carotid, radial artery, dorsalis pedis and  posterior tibial pulses are full and equal. No bruits present. Neurologic: Alert and oriented x3. Deep tendon reflexes symmetrical and normal.          Skin: Intact without suspicious lesions or rashes. Lymph: No cervical, axillary lymphadenopathy present. Psych: Mood and affect are normal. Normally interactive  Assessment & Plan:  #1 anxiety/depression; well controlled on present dose of sertraline  #2 perennial rhinitis  #3 thyroid upper limits of normal in size  #4 active smoker  Plan: See orders and recommendations

## 2012-03-11 LAB — VITAMIN D 1,25 DIHYDROXY: Vitamin D3 1, 25 (OH)2: 48 pg/mL

## 2012-05-18 ENCOUNTER — Other Ambulatory Visit: Payer: Self-pay | Admitting: Internal Medicine

## 2012-05-18 NOTE — Telephone Encounter (Signed)
Refill done.  

## 2012-07-07 ENCOUNTER — Other Ambulatory Visit: Payer: Self-pay

## 2012-09-24 ENCOUNTER — Other Ambulatory Visit: Payer: Self-pay | Admitting: Otolaryngology

## 2012-12-24 ENCOUNTER — Other Ambulatory Visit: Payer: Self-pay | Admitting: Otolaryngology

## 2013-02-10 ENCOUNTER — Other Ambulatory Visit: Payer: Self-pay | Admitting: Internal Medicine

## 2013-02-13 ENCOUNTER — Other Ambulatory Visit: Payer: Self-pay | Admitting: *Deleted

## 2013-02-13 DIAGNOSIS — F411 Generalized anxiety disorder: Secondary | ICD-10-CM

## 2013-02-13 MED ORDER — SERTRALINE HCL 50 MG PO TABS: 50.0000 mg | ORAL_TABLET | Freq: Every day | ORAL | Status: AC

## 2013-02-13 NOTE — Telephone Encounter (Signed)
Refill for 30 day supply only sent to pharmacy. Patient must have OV prior to additional refills.

## 2013-03-14 ENCOUNTER — Ambulatory Visit
Admission: RE | Admit: 2013-03-14 | Discharge: 2013-03-14 | Disposition: A | Discharge status: Home / Self Care | Payer: 59 | Source: Ambulatory Visit | Attending: Otolaryngology | Admitting: Otolaryngology

## 2013-03-14 ENCOUNTER — Other Ambulatory Visit: Payer: Self-pay | Admitting: Otolaryngology

## 2013-03-14 DIAGNOSIS — C329 Malignant neoplasm of larynx, unspecified: Secondary | ICD-10-CM

## 2013-03-28 ENCOUNTER — Other Ambulatory Visit: Payer: Self-pay

## 2013-10-30 IMAGING — CR DG CHEST 2V
2 series · 2 of 2 positions shown · non-contrast
Comparison: None.

CLINICAL DATA: Neoplasm of the right vocal cord, cough, smoking
history

EXAM:
CHEST  2 VIEW

[w chest pa]
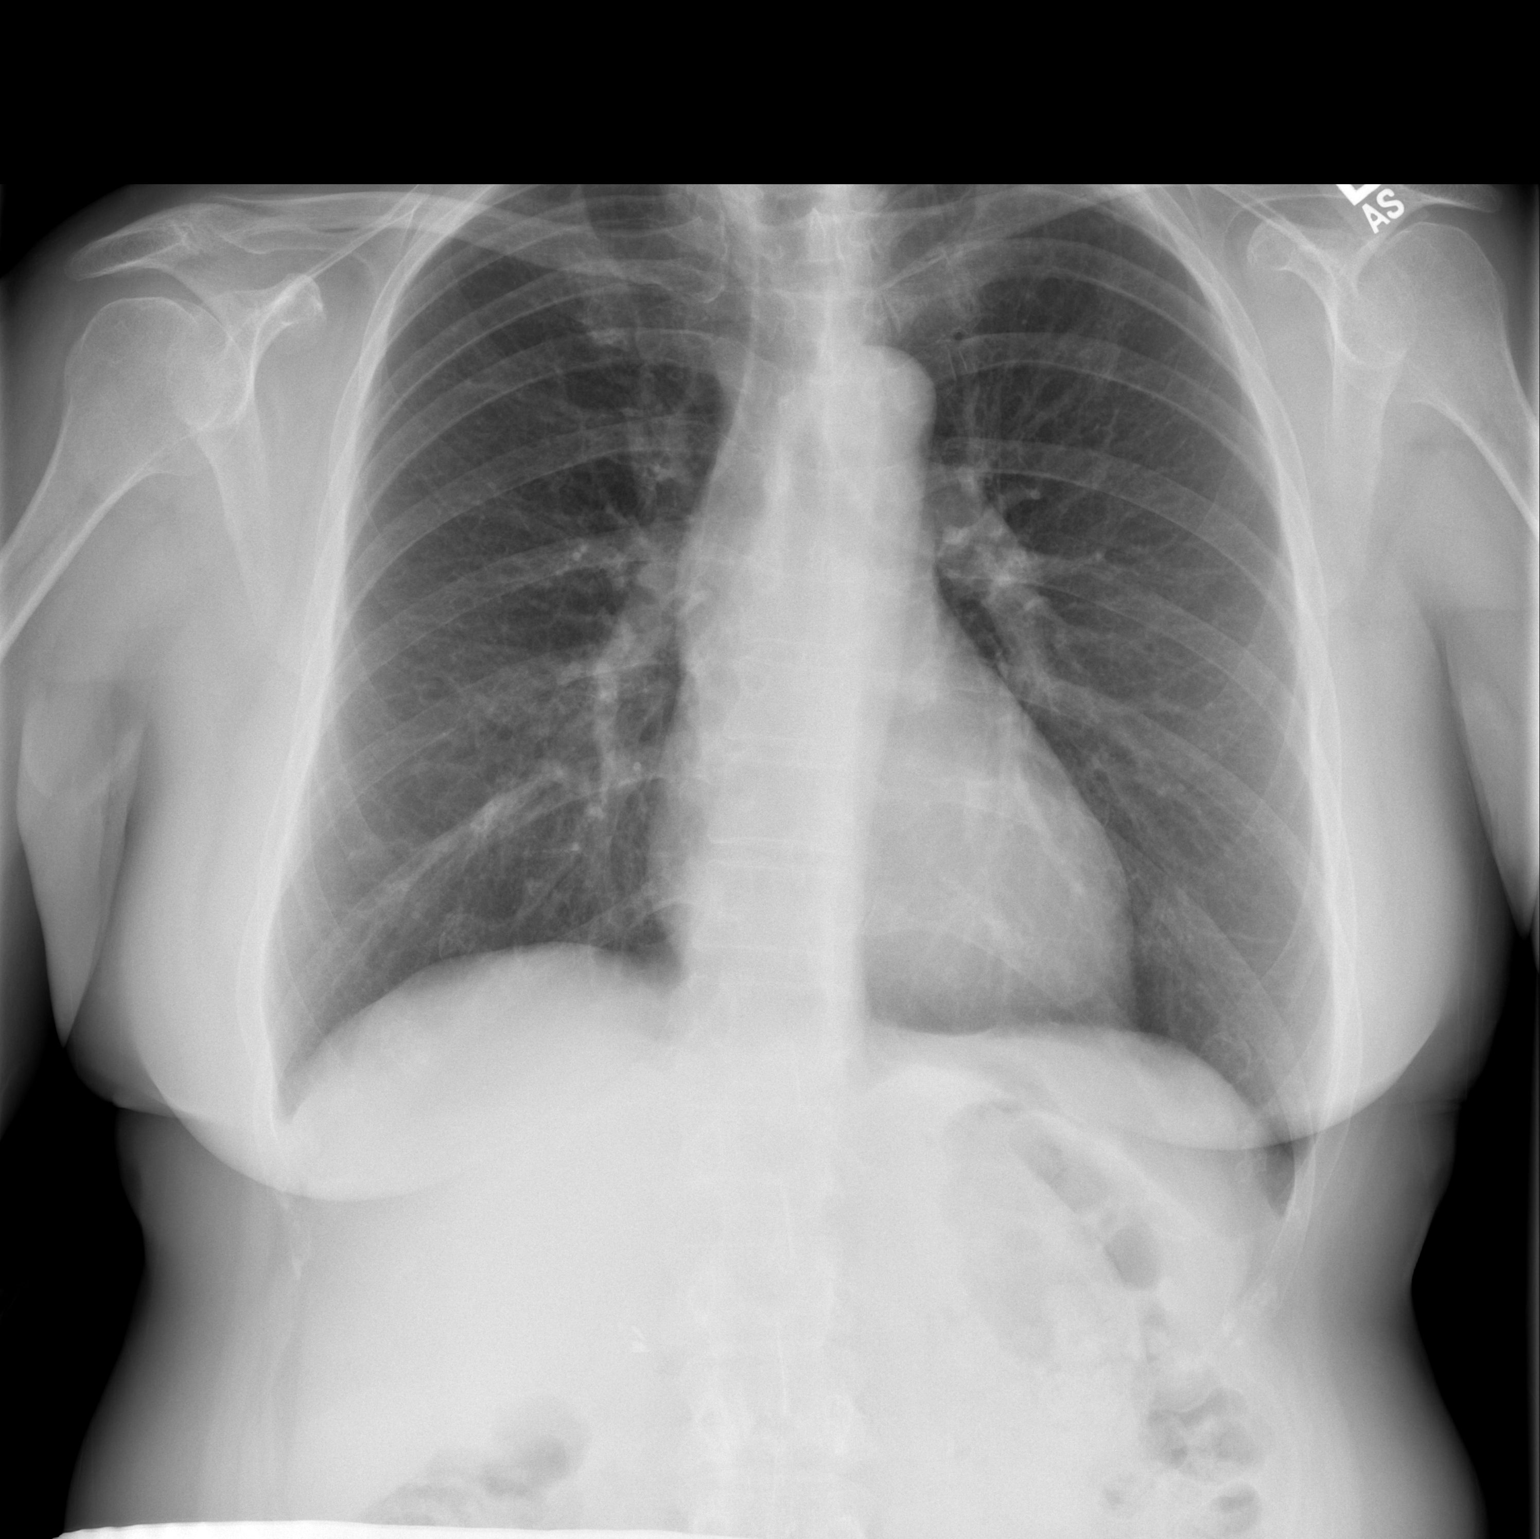

[w chest lat]
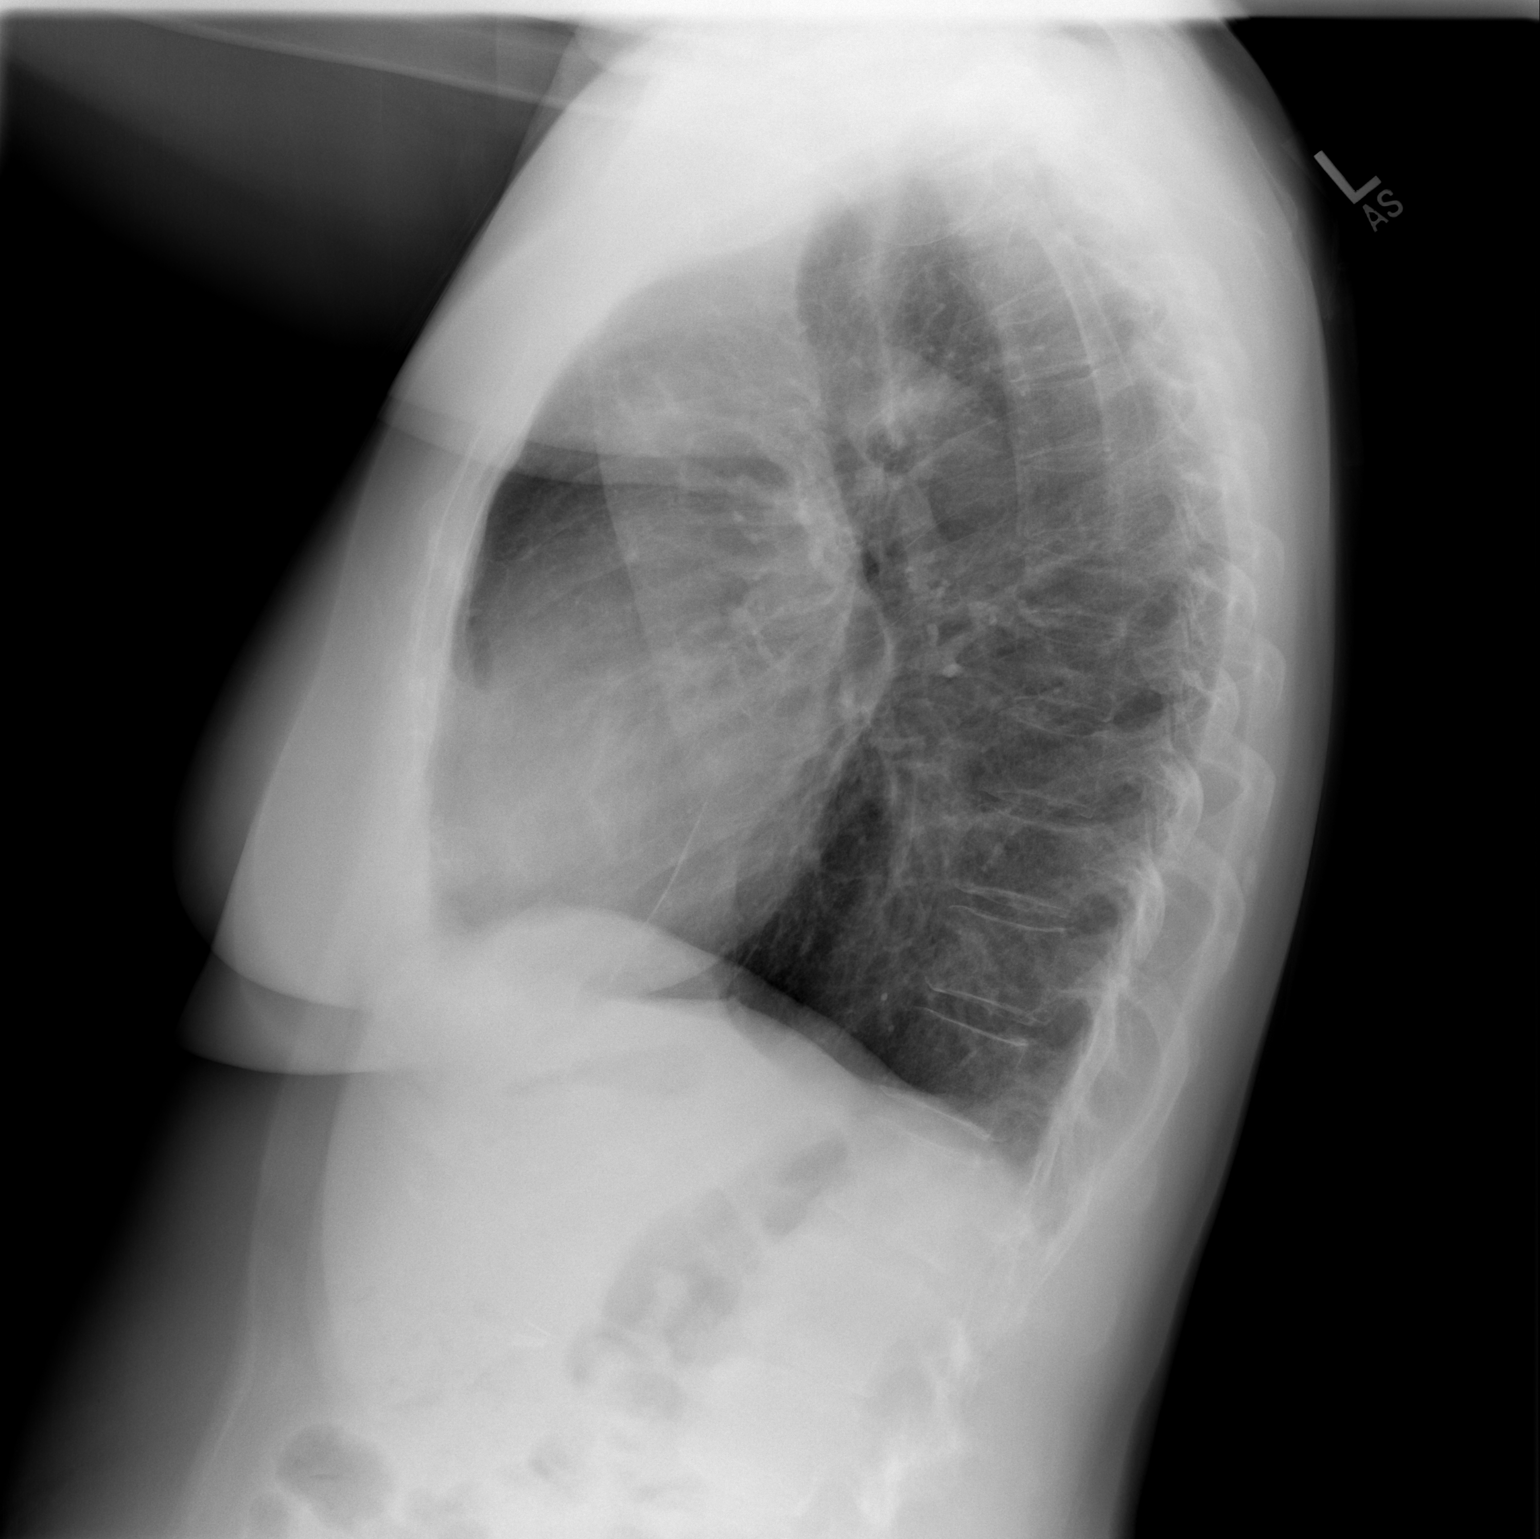

[2 of 2 positions shown; findings below may reference images not displayed]

FINDINGS: No active infiltrate or effusion is seen. Mediastinal contours
appear normal. No definite adenopathy is noted. The heart is within
upper limits of normal. No acute bony abnormality is seen.
IMPRESSION: No active lung disease.

## 2014-01-17 ENCOUNTER — Other Ambulatory Visit: Payer: Self-pay | Admitting: Obstetrics and Gynecology

## 2014-01-17 DIAGNOSIS — Z1231 Encounter for screening mammogram for malignant neoplasm of breast: Secondary | ICD-10-CM

## 2014-01-29 ENCOUNTER — Ambulatory Visit: Payer: 59

## 2014-02-13 ENCOUNTER — Ambulatory Visit
Admission: RE | Admit: 2014-02-13 | Discharge: 2014-02-13 | Disposition: A | Discharge status: Home / Self Care | Payer: 59 | Source: Ambulatory Visit | Attending: Obstetrics and Gynecology | Admitting: Obstetrics and Gynecology

## 2014-02-13 DIAGNOSIS — Z1231 Encounter for screening mammogram for malignant neoplasm of breast: Secondary | ICD-10-CM

## 2020-07-15 ENCOUNTER — Other Ambulatory Visit (HOSPITAL_COMMUNITY): Payer: Self-pay | Admitting: Family Medicine

## 2020-07-15 DIAGNOSIS — I6523 Occlusion and stenosis of bilateral carotid arteries: Secondary | ICD-10-CM

## 2020-07-17 ENCOUNTER — Other Ambulatory Visit (HOSPITAL_COMMUNITY): Payer: Self-pay | Admitting: Family Medicine

## 2020-07-17 DIAGNOSIS — R011 Cardiac murmur, unspecified: Secondary | ICD-10-CM

## 2020-07-22 ENCOUNTER — Ambulatory Visit (HOSPITAL_COMMUNITY): Admission: RE | Admit: 2020-07-22 | Payer: 59 | Source: Ambulatory Visit

## 2020-07-28 ENCOUNTER — Ambulatory Visit (HOSPITAL_COMMUNITY)
Admission: RE | Admit: 2020-07-28 | Discharge: 2020-07-28 | Disposition: A | Discharge status: Home / Self Care | Payer: 59 | Source: Ambulatory Visit | Attending: Family Medicine | Admitting: Family Medicine

## 2020-07-28 ENCOUNTER — Other Ambulatory Visit: Payer: Self-pay

## 2020-07-28 DIAGNOSIS — I6523 Occlusion and stenosis of bilateral carotid arteries: Secondary | ICD-10-CM | POA: Insufficient documentation

## 2020-07-28 NOTE — Progress Notes (Signed)
Carotid US completed    Please see CV Proc for preliminary results.   Damiano Stamper, RVT  

## 2020-10-06 ENCOUNTER — Other Ambulatory Visit (HOSPITAL_COMMUNITY): Payer: 59

## 2020-10-07 ENCOUNTER — Other Ambulatory Visit: Payer: Self-pay

## 2020-10-07 ENCOUNTER — Ambulatory Visit (HOSPITAL_COMMUNITY): Payer: 59 | Attending: Family Medicine

## 2020-10-07 DIAGNOSIS — R011 Cardiac murmur, unspecified: Secondary | ICD-10-CM | POA: Insufficient documentation

## 2020-10-07 LAB — ECHOCARDIOGRAM COMPLETE
Area-P 1/2: 1.27 cm2
S' Lateral: 3.6 cm

## 2020-10-07 MED ORDER — PERFLUTREN LIPID MICROSPHERE
1.0000 mL | INTRAVENOUS | Status: AC | PRN
  Administered 2020-10-07: 1 mL via INTRAVENOUS
